# Patient Record
Sex: Female | Born: 2005
Health system: Southern US, Community
[De-identification: ages and names within clinical notes are randomized; demographics above are authoritative.]

## PROBLEM LIST (undated history)

## (undated) DIAGNOSIS — T8859XA Other complications of anesthesia, initial encounter: Secondary | ICD-10-CM

## (undated) DIAGNOSIS — T4145XA Adverse effect of unspecified anesthetic, initial encounter: Secondary | ICD-10-CM

## (undated) DIAGNOSIS — F909 Attention-deficit hyperactivity disorder, unspecified type: Secondary | ICD-10-CM

## (undated) HISTORY — PX: ADENOIDECTOMY: SUR15

---
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
## 2024-01-13 ENCOUNTER — Ambulatory Visit (INDEPENDENT_AMBULATORY_CARE_PROVIDER_SITE_OTHER): Payer: Self-pay | Admitting: Radiology

## 2024-01-13 ENCOUNTER — Encounter: Payer: Self-pay | Admitting: Radiology

## 2024-01-13 VITALS — BP 102/60 | HR 93 | Ht 69.69 in | Wt 146.6 lb

## 2024-01-13 DIAGNOSIS — N760 Acute vaginitis: Secondary | ICD-10-CM | POA: Diagnosis not present

## 2024-01-13 LAB — WET PREP FOR TRICH, YEAST, CLUE

## 2024-01-13 MED ORDER — NYSTATIN-TRIAMCINOLONE 100000-0.1 UNIT/GM-% EX OINT: 1.0000 | TOPICAL_OINTMENT | Freq: Two times a day (BID) | CUTANEOUS | 1 refills | Status: AC

## 2024-01-13 MED ORDER — FLUCONAZOLE 150 MG PO TABS: 150.0000 mg | ORAL_TABLET | ORAL | 0 refills | Status: AC

## 2024-01-13 NOTE — Progress Notes (Signed)
      Subjective: Briana Hayes is a 18 y.o. female who complains of external vulvar irritation and itching x 2 weeks. No changes to soaps or detergent. Denies sitting in wet or sweaty clothing.  Review of Systems  All other systems reviewed and are negative.   Past Medical History:  Diagnosis Date   ADHD (attention deficit hyperactivity disorder)    Complication of anesthesia    when waking up reports that patient was aggressively try to removal breathing tube      Objective:  Today's Vitals   01/13/24 0804  BP: 102/60  Pulse: 93  SpO2: 97%  Weight: 146 lb 9.6 oz (66.5 kg)  Height: 5' 9.69 (1.77 m)   Body mass index is 21.23 kg/m.   Physical Exam Vitals and nursing note reviewed. Exam conducted with a chaperone present.  Constitutional:      Appearance: Normal appearance. She is well-developed.  Pulmonary:     Effort: Pulmonary effort is normal.  Abdominal:     General: Abdomen is flat.     Palpations: Abdomen is soft.  Genitourinary:    General: Normal vulva.     Vagina: Vaginal discharge present. No erythema, bleeding or lesions.     Cervix: Normal. No discharge, friability, lesion or erythema.     Uterus: Normal.      Adnexa: Right adnexa normal and left adnexa normal.  Neurological:     Mental Status: She is alert.  Psychiatric:        Mood and Affect: Mood normal.        Thought Content: Thought content normal.        Judgment: Judgment normal.      Microscopic wet-mount exam shows hyphae.   Darice Hoit, CMA present for exam  Assessment:/Plan:   1. Vulvovaginitis (Primary) + yeast - WET PREP FOR TRICH, YEAST, CLUE - nystatin -triamcinolone  ointment (MYCOLOG); Apply 1 Application topically 2 (two) times daily. Use for 1 - 2 weeks a time as needed.  Dispense: 30 g; Refill: 1 - fluconazole  (DIFLUCAN ) 150 MG tablet; Take 1 tablet (150 mg total) by mouth every 3 (three) days.  Dispense: 2 tablet; Refill: 0   Avoid the use of soaps or perfumed  products in the peri area. Avoid tub baths and sitting in sweaty or wet clothing for prolonged periods of time.   No follow-ups on file.   Cailen Mihalik B, NP 8:26 AM

## 2024-01-15 DIAGNOSIS — L728 Other follicular cysts of the skin and subcutaneous tissue: Secondary | ICD-10-CM | POA: Diagnosis not present

## 2024-01-15 DIAGNOSIS — L710 Perioral dermatitis: Secondary | ICD-10-CM | POA: Diagnosis not present

## 2024-01-20 ENCOUNTER — Ambulatory Visit: Admitting: Radiology

## 2024-03-03 DIAGNOSIS — L710 Perioral dermatitis: Secondary | ICD-10-CM | POA: Diagnosis not present

## 2024-06-28 ENCOUNTER — Encounter: Payer: Self-pay | Admitting: Obstetrics and Gynecology

## 2024-06-28 ENCOUNTER — Ambulatory Visit: Admitting: Obstetrics and Gynecology

## 2024-06-28 NOTE — Progress Notes (Deleted)
 "  18 y.o. G0P0000 Single Caucasian female here for annual exam.    PCP: Marrie Kay, MD   No LMP recorded.           Sexually active: Yes.    The current method of family planning is IUD Mirena  09/18/22.    Menopausal hormone therapy:  n/a Exercising: {yes no:314532}  {types:19826} Smoker:  no  OB History  Gravida Para Term Preterm AB Living  0 0 0 0 0 0  SAB IAB Ectopic Multiple Live Births  0 0 0 0 0     HEALTH MAINTENANCE: Last 2 paps:  n/a History of abnormal Pap or positive HPV:  no Mammogram:   n/a Colonoscopy:  n/a Bone Density:  n/a  Result  n/a   Immunization History  Administered Date(s) Administered   Influenza,inj,quad, With Preservative 04/16/2016      reports that she has never smoked. She has never been exposed to tobacco smoke. She has never used smokeless tobacco. She reports that she does not drink alcohol and does not use drugs.  Past Medical History:  Diagnosis Date   ADHD (attention deficit hyperactivity disorder)    Complication of anesthesia    when waking up reports that patient was aggressively try to removal breathing tube    Past Surgical History:  Procedure Laterality Date   ADENOIDECTOMY     ago 10   INGUINAL HERNIA REPAIR Left 03/19/2017   Procedure: LAPAROSCOPIC LEFT INGUINAL HERNIA REPAIR;  Surgeon: Chuckie Casimiro KIDD, MD;  Location: [facility] OR;  Service: General;  Laterality: Left;    Current Outpatient Medications  Medication Sig Dispense Refill   fluconazole  (DIFLUCAN ) 150 MG tablet Take 1 tablet (150 mg total) by mouth every 3 (three) days. 2 tablet 0   levonorgestrel  (MIRENA ) 20 MCG/DAY IUD 1 each by Intrauterine route once.     nystatin -triamcinolone  ointment (MYCOLOG) Apply 1 Application topically 2 (two) times daily. Use for 1 - 2 weeks a time as needed. 30 g 1   No current facility-administered medications for this visit.    ALLERGIES: Sulfa antibiotics, Tamiflu [oseltamivir phosphate], and Oseltamivir  No family  history on file.  Review of Systems  PHYSICAL EXAM:  There were no vitals taken for this visit.    General appearance: alert, cooperative and appears stated age Head: normocephalic, without obvious abnormality, atraumatic Neck: no adenopathy, supple, symmetrical, trachea midline and thyroid normal to inspection and palpation Lungs: clear to auscultation bilaterally Breasts: normal appearance, no masses or tenderness, No nipple retraction or dimpling, No nipple discharge or bleeding, No axillary adenopathy Heart: regular rate and rhythm Abdomen: soft, non-tender; no masses, no organomegaly Extremities: extremities normal, atraumatic, no cyanosis or edema Skin: skin color, texture, turgor normal. No rashes or lesions Lymph nodes: cervical, supraclavicular, and axillary nodes normal. Neurologic: grossly normal  Pelvic: External genitalia:  no lesions              No abnormal inguinal nodes palpated.              Urethra:  normal appearing urethra with no masses, tenderness or lesions              Bartholins and Skenes: normal                 Vagina: normal appearing vagina with normal color and discharge, no lesions              Cervix: no lesions  Pap taken: {yes no:314532} Bimanual Exam:  Uterus:  normal size, contour, position, consistency, mobility, non-tender              Adnexa: no mass, fullness, tenderness              Rectal exam: {yes no:314532}.  Confirms.              Anus:  normal sphincter tone, no lesions  Chaperone was present for exam:  {BSCHAPERONE:31226::Emily F, CMA}  ASSESSMENT: Well woman visit with gynecologic exam.  PHQ-2-9: ***  ***  PLAN: Mammogram screening discussed. Self breast awareness reviewed. Pap and HRV collected:  {yes no:314532} Guidelines for Calcium, Vitamin D, regular exercise program including cardiovascular and weight bearing exercise. Medication refills:  *** {LABS (Optional):23779} Follow up:  ***    Additional  counseling given.  {yes [id number]. ***  total time was spent for this patient encounter, including preparation, face-to-face counseling with the patient, coordination of care, and documentation of the encounter in addition to doing the well woman visit with gynecologic exam.        "
